# Patient Record
Sex: Female | Born: 1990 | Race: White | Hispanic: No | Marital: Single | Smoking: Never smoker
Health system: Southern US, Community
[De-identification: ages and names within clinical notes are randomized; demographics above are authoritative.]

## PROBLEM LIST (undated history)

## (undated) DIAGNOSIS — I7300 Raynaud's syndrome without gangrene: Secondary | ICD-10-CM

## (undated) DIAGNOSIS — R51 Headache: Secondary | ICD-10-CM

## (undated) DIAGNOSIS — R1900 Intra-abdominal and pelvic swelling, mass and lump, unspecified site: Secondary | ICD-10-CM

## (undated) DIAGNOSIS — F419 Anxiety disorder, unspecified: Secondary | ICD-10-CM

## (undated) HISTORY — PX: OTHER SURGICAL HISTORY: SHX169

## (undated) HISTORY — DX: Anxiety disorder, unspecified: F41.9

## (undated) HISTORY — DX: Intra-abdominal and pelvic swelling, mass and lump, unspecified site: R19.00

## (undated) HISTORY — DX: Raynaud's syndrome without gangrene: I73.00

---

## 2011-11-04 ENCOUNTER — Other Ambulatory Visit: Payer: Self-pay | Admitting: Obstetrics & Gynecology

## 2011-11-04 ENCOUNTER — Ambulatory Visit
Admission: RE | Admit: 2011-11-04 | Discharge: 2011-11-04 | Disposition: A | Discharge status: Home / Self Care | Payer: BC Managed Care – PPO | Source: Ambulatory Visit | Attending: Obstetrics & Gynecology | Admitting: Obstetrics & Gynecology

## 2011-11-04 DIAGNOSIS — R1900 Intra-abdominal and pelvic swelling, mass and lump, unspecified site: Secondary | ICD-10-CM

## 2011-11-04 MED ORDER — IOHEXOL 300 MG/ML  SOLN
100.0000 mL | Freq: Once | INTRAMUSCULAR | Status: AC | PRN
  Administered 2011-11-04: 100 mL via INTRAVENOUS

## 2011-11-15 ENCOUNTER — Encounter: Payer: Self-pay | Admitting: Gynecologic Oncology

## 2011-11-16 ENCOUNTER — Encounter: Payer: Self-pay | Admitting: Gynecology

## 2011-11-16 ENCOUNTER — Ambulatory Visit: Payer: BC Managed Care – PPO | Attending: Gynecology | Admitting: Gynecology

## 2011-11-16 VITALS — BP 122/68 | HR 74 | Temp 97.6°F | Resp 20 | Ht 63.0 in | Wt 149.1 lb

## 2011-11-16 DIAGNOSIS — Z8249 Family history of ischemic heart disease and other diseases of the circulatory system: Secondary | ICD-10-CM | POA: Insufficient documentation

## 2011-11-16 DIAGNOSIS — Z808 Family history of malignant neoplasm of other organs or systems: Secondary | ICD-10-CM | POA: Insufficient documentation

## 2011-11-16 DIAGNOSIS — R1909 Other intra-abdominal and pelvic swelling, mass and lump: Secondary | ICD-10-CM | POA: Insufficient documentation

## 2011-11-16 DIAGNOSIS — R1900 Intra-abdominal and pelvic swelling, mass and lump, unspecified site: Secondary | ICD-10-CM

## 2011-11-16 DIAGNOSIS — Z888 Allergy status to other drugs, medicaments and biological substances status: Secondary | ICD-10-CM | POA: Insufficient documentation

## 2011-11-16 DIAGNOSIS — D279 Benign neoplasm of unspecified ovary: Secondary | ICD-10-CM | POA: Insufficient documentation

## 2011-11-16 DIAGNOSIS — Z833 Family history of diabetes mellitus: Secondary | ICD-10-CM | POA: Insufficient documentation

## 2011-11-16 DIAGNOSIS — D270 Benign neoplasm of right ovary: Secondary | ICD-10-CM

## 2011-11-16 DIAGNOSIS — F411 Generalized anxiety disorder: Secondary | ICD-10-CM | POA: Insufficient documentation

## 2011-11-16 DIAGNOSIS — I7300 Raynaud's syndrome without gangrene: Secondary | ICD-10-CM | POA: Insufficient documentation

## 2011-11-16 NOTE — Progress Notes (Signed)
Consult Note: Gyn-Onc   Emily Conner 21 y.o. female  Chief Complaint  Patient presents with  . Pelvic Mass    New pt       HPI: 21 year old white single female seen in consultation at the request of Dr. Mitzi Hansen regarding management of a newly diagnosed complex pelvic mass.  The patient reports that she has had intermittent lower abdominal pain over the past 4 years. Recently, however, the symptoms have become more frequent and she developed a very severe episode while at work each ago. She was seen at urgent care and subsequently has had an ultrasound and a CT scan of the abdomen and pelvis. The large complex pelvic mass measures 11.7 x 8.7 x 15.1 cm it is predominantly cystic but has multiple thick internal septations and a peripheral rim of enhancement and some peripheral calcifications. In addition there may be a focus of consistent with internal fat. There is no evidence of adenopathy or ascites. She has a normal alpha-fetoprotein, and LDH,. However beta hCG is 63 mIU per mL. The patient reports that she has regular cyclic menses. Is not sexually active, and does not use any oral contraceptives or other medications. Review of Systems:10 point review of systems is negative as noted above.   Vitals: Blood pressure 122/68, pulse 74, temperature 97.6 F (36.4 C), resp. rate 20, height 5\' 3"  (1.6 m), weight 149 lb 1.6 oz (67.631 kg), last menstrual period 11/04/2011.  Physical Exam: General : The patient is a healthy woman in no acute distress.  HEENT: normocephalic, extraoccular movements normal; neck is supple without thyromegally  Lynphnodes: Supraclavicular and inguinal nodes not enlarged  Abdomen: Soft, non-tender, no ascites, no organomegally, , no hernias   There is fullness in the right lower quadrant consistent with the mass that is nontender. There is no rebound. Pelvic:  EGBUS: Normal female  Vagina: Normal, no lesions  Urethra and Bladder: Normal, non-tender  Cervix:  Normal  Uterus: Difficult to outline  Bi-manual examination: Non-tender; a cystic masses palpated predominantly on the right side measuring approximately 10 cm in diameter. Rectal: Deferred Lower extremities: No edema or varicosities. Normal range of motion    Assessment/Plan: Complex pelvic mass which is causing intermittent pain. I believe this is most likely secondary to intermittent torsion. Her recommend that the patient undergo exploratory laparotomy and ovarian cystectomy with intraoperative frozen section. I suspect this is most likely a teratoma although with the elevated hCG a germ cell tumor as a possibility.  The patient and her family are plan a vacation in [place] next week and therefore they would like to defer surgery until June 18. We will plan on performing the surgery through a Pfannenstiel incision.  Allergies  Allergen Reactions  . Minocycline     Headache, dizziness    Past Medical History  Diagnosis Date  . Anxiety   . Raynaud's disease   . Pelvic mass     History reviewed. No pertinent past surgical history.  No current outpatient prescriptions on file.    History   Social History  . Marital Status: Single    Spouse Name: N/A    Number of Children: N/A  . Years of Education: N/A   Occupational History  . Not on file.   Social History Main Topics  . Smoking status: Never Smoker   . Smokeless tobacco: Not on file  . Alcohol Use: No  . Drug Use: Not on file  . Sexually Active: Not on file   Other Topics  Concern  . Not on file   Social History Narrative  . No narrative on file    Family History  Problem Relation Age of Onset  . Hypertension Mother   . Diabetes Maternal Grandmother   . Heart disease Maternal Grandmother   . Heart disease Maternal Grandfather   . Diabetes Paternal Grandfather   . Heart disease Paternal Grandfather   . Skin cancer Paternal Grandfather   . Diabetes Father   . Heart disease Paternal Grandmother        Jeannette Corpus, MD 11/16/2011, 11:38 AM

## 2011-11-29 ENCOUNTER — Encounter (HOSPITAL_COMMUNITY): Payer: Self-pay | Admitting: Pharmacy Technician

## 2011-12-02 ENCOUNTER — Encounter (HOSPITAL_COMMUNITY): Payer: Self-pay

## 2011-12-02 ENCOUNTER — Encounter (HOSPITAL_COMMUNITY)
Admission: RE | Admit: 2011-12-02 | Discharge: 2011-12-02 | Disposition: A | Discharge status: Home / Self Care | Payer: BC Managed Care – PPO | Source: Ambulatory Visit | Attending: Obstetrics & Gynecology | Admitting: Obstetrics & Gynecology

## 2011-12-02 HISTORY — DX: Headache: R51

## 2011-12-02 LAB — COMPREHENSIVE METABOLIC PANEL
AST: 18 U/L (ref 0–37)
Albumin: 4.1 g/dL (ref 3.5–5.2)
Alkaline Phosphatase: 75 U/L (ref 39–117)
CO2: 27 mEq/L (ref 19–32)
Chloride: 103 mEq/L (ref 96–112)
Creatinine, Ser: 0.7 mg/dL (ref 0.50–1.10)
GFR calc non Af Amer: >90 mL/min (ref >90)
Potassium: 3.6 mEq/L (ref 3.5–5.1)
Total Bilirubin: 0.2 mg/dL — ABNORMAL LOW (ref 0.3–1.2)

## 2011-12-02 LAB — CBC
HCT: 41.6 % (ref 36.0–46.0)
MCV: 88.7 fL (ref 78.0–100.0)
Platelets: 239 10*3/uL (ref 150–400)
RBC: 4.69 MIL/uL (ref 3.87–5.11)
WBC: 5.2 10*3/uL (ref 4.0–10.5)

## 2011-12-02 LAB — DIFFERENTIAL
Basophils Absolute: 0 10*3/uL (ref 0.0–0.1)
Basophils Relative: 0 % (ref 0–1)
Eosinophils Absolute: 0.1 10*3/uL (ref 0.0–0.7)
Eosinophils Relative: 1 % (ref 0–5)
Monocytes Absolute: 0.4 10*3/uL (ref 0.1–1.0)
Monocytes Relative: 8 % (ref 3–12)
Neutro Abs: 3.2 10*3/uL (ref 1.7–7.7)

## 2011-12-02 LAB — ABO/RH: ABO/RH(D): A NEG

## 2011-12-02 NOTE — Patient Instructions (Signed)
YOUR SURGERY IS SCHEDULED ON:  Tuesday  6/18  AT 1:25 PM  REPORT TO [facility] AT:  11:00 AM      PHONE # FOR SHORT STAY IS [phone]  FOLLOW YOUR BOWEL PREP INSTRUCTIONS FROM DR. CLARKE-PEARSON'S OFFICE - DAY BEFORE YOUR SURGERY.   DO NOT EAT  ANYTHING AFTER MIDNIGHT THE NIGHT BEFORE YOUR SURGERY.  NO FOOD, NO CHEWING GUM, NO MINTS, NO CANDIES, NO CHEWING TOBACCO.  YOU MAY HAVE CLEAR LIQUIDS TO DRINK FROM MIDNIGHT UNTIL 7:00 AM THE DAY OF YOUR SURGERY--BUT NOTHING TO DRINK AFTER 7:00 AM DAY OF SURGERY.  PLEASE TAKE THE FOLLOWING MEDICATIONS THE AM OF YOUR SURGERY WITH A FEW SIPS OF WATER:  CLONAZEPAM    IF YOU USE INHALERS--USE YOUR INHALERS THE AM OF YOUR SURGERY AND BRING INHALERS TO THE HOSPITAL -TAKE TO SURGERY.    IF YOU ARE DIABETIC:  DO NOT TAKE ANY DIABETIC MEDICATIONS THE AM OF YOUR SURGERY.  IF YOU TAKE INSULIN IN THE EVENINGS--PLEASE ONLY TAKE 1/2 NORMAL EVENING DOSE THE NIGHT BEFORE YOUR SURGERY.  NO INSULIN THE AM OF YOUR SURGERY.  IF YOU HAVE SLEEP APNEA AND USE CPAP OR BIPAP--PLEASE BRING THE MASK --NOT THE MACHINE-NOT THE TUBING   -JUST THE MASK. DO NOT BRING VALUABLES, MONEY, CREDIT CARDS.  CONTACT LENS, DENTURES / PARTIALS, GLASSES SHOULD NOT BE WORN TO SURGERY AND IN MOST CASES-HEARING AIDS WILL NEED TO BE REMOVED.  BRING YOUR GLASSES CASE, ANY EQUIPMENT NEEDED FOR YOUR CONTACT LENS. FOR PATIENTS ADMITTED TO THE HOSPITAL--CHECK OUT TIME THE DAY OF DISCHARGE IS 11:00 AM.  ALL INPATIENT ROOMS ARE PRIVATE - WITH BATHROOM, TELEPHONE, TELEVISION AND WIFI INTERNET. IF YOU ARE BEING DISCHARGED THE SAME DAY OF YOUR SURGERY--YOU CAN NOT DRIVE YOURSELF HOME--AND SHOULD NOT GO HOME ALONE BY TAXI OR BUS.  NO DRIVING OR OPERATING MACHINERY FOR 24 HOURS FOLLOWING ANESTHESIA / PAIN MEDICATIONS.                            SPECIAL INSTRUCTIONS:  CHLORHEXIDINE SOAP SHOWER (other brand names are Betasept and Hibiclens ) PLEASE SHOWER WITH CHLORHEXIDINE THE NIGHT  BEFORE YOUR SURGERY AND THE AM OF YOUR SURGERY. DO NOT USE CHLORHEXIDINE ON YOUR FACE OR PRIVATE AREAS--YOU MAY USE YOUR NORMAL SOAP THOSE AREAS AND YOUR NORMAL SHAMPOO.  WOMEN SHOULD AVOID SHAVING UNDER ARMS AND SHAVING LEGS 48 HOURS BEFORE USING CHLORHEXIDINE TO AVOID SKIN IRRITATION.  DO NOT USE IF ALLERGIC TO CHLORHEXIDINE.  PLEASE READ OVER ANY  FACT SHEETS THAT YOU WERE GIVEN: MRSA INFORMATION, BLOOD TRANSFUSION INFORMATION, INCENTIVE SPIROMETRY TURNING, COUGHING, DEEP BREATHING, LEG EXERCISES ARE IMPORTANT TO DO AFTER YOUR SURGERY.

## 2011-12-02 NOTE — Pre-Procedure Instructions (Signed)
CBC, DIFF, CMET, T/S WERE DONE TODAY PREOP AT [facility]. PREOP INSTRUCTIONS DISCUSSED WITH PT USING TEACH BACK METHOD.

## 2011-12-04 LAB — MRSA CULTURE

## 2011-12-05 NOTE — Progress Notes (Addendum)
Telephone message left with pt and her mother about change in time of surgery for 12/06/11. Message left about NPO after midnight. Telephone number is left on answering machine for pt to call back that she got message. [phone] 12/04/11  Spoke with patient directly that her surgical time has been changed and she is to have nothing to eat or drink after midnight. She is able to verbalize these changes.

## 2011-12-06 ENCOUNTER — Ambulatory Visit (HOSPITAL_COMMUNITY): Payer: BC Managed Care – PPO | Admitting: Anesthesiology

## 2011-12-06 ENCOUNTER — Encounter (HOSPITAL_COMMUNITY): Payer: Self-pay | Admitting: Surgery

## 2011-12-06 ENCOUNTER — Encounter (HOSPITAL_COMMUNITY): Payer: Self-pay | Admitting: Anesthesiology

## 2011-12-06 ENCOUNTER — Encounter (HOSPITAL_COMMUNITY)
Admission: RE | Disposition: A | Discharge status: Home / Self Care | Payer: Self-pay | Source: Ambulatory Visit | Attending: Obstetrics & Gynecology

## 2011-12-06 ENCOUNTER — Inpatient Hospital Stay (HOSPITAL_COMMUNITY)
Admission: RE | Admit: 2011-12-06 | Discharge: 2011-12-08 | DRG: 359 | Disposition: A | Discharge status: Home / Self Care | Payer: BC Managed Care – PPO | Source: Ambulatory Visit | Attending: Obstetrics & Gynecology | Admitting: Obstetrics & Gynecology

## 2011-12-06 ENCOUNTER — Encounter (HOSPITAL_COMMUNITY): Payer: Self-pay | Admitting: *Deleted

## 2011-12-06 DIAGNOSIS — R1900 Intra-abdominal and pelvic swelling, mass and lump, unspecified site: Secondary | ICD-10-CM

## 2011-12-06 DIAGNOSIS — Z01812 Encounter for preprocedural laboratory examination: Secondary | ICD-10-CM

## 2011-12-06 DIAGNOSIS — D279 Benign neoplasm of unspecified ovary: Principal | ICD-10-CM | POA: Diagnosis present

## 2011-12-06 HISTORY — PX: OVARIAN CYST REMOVAL: SHX89

## 2011-12-06 SURGERY — EXCISION, CYST, OVARY
Anesthesia: General | Site: Abdomen | Laterality: Left | Wound class: Clean

## 2011-12-06 MED ORDER — KCL IN DEXTROSE-NACL 20-5-0.45 MEQ/L-%-% IV SOLN
INTRAVENOUS | Status: DC
  Administered 2011-12-06 – 2011-12-07 (×3): via INTRAVENOUS
  Filled 2011-12-06 (×4): qty 1000

## 2011-12-06 MED ORDER — ONDANSETRON HCL 4 MG/2ML IJ SOLN
4.0000 mg | Freq: Four times a day (QID) | INTRAMUSCULAR | Status: DC | PRN
  Administered 2011-12-07: 4 mg via INTRAVENOUS
  Filled 2011-12-06 (×2): qty 2

## 2011-12-06 MED ORDER — PROPOFOL 10 MG/ML IV EMUL
INTRAVENOUS | Status: DC | PRN
  Administered 2011-12-06: 150 mg via INTRAVENOUS

## 2011-12-06 MED ORDER — GLYCOPYRROLATE 0.2 MG/ML IJ SOLN
INTRAMUSCULAR | Status: DC | PRN
  Administered 2011-12-06: .3 mg via INTRAVENOUS

## 2011-12-06 MED ORDER — 0.9 % SODIUM CHLORIDE (POUR BTL) OPTIME
TOPICAL | Status: DC | PRN
  Administered 2011-12-06: 1000 mL
  Administered 2011-12-06: 2000 mL

## 2011-12-06 MED ORDER — SODIUM CHLORIDE 0.9 % IJ SOLN: 9.0000 mL | INTRAMUSCULAR | Status: DC | PRN

## 2011-12-06 MED ORDER — KCL IN DEXTROSE-NACL 20-5-0.45 MEQ/L-%-% IV SOLN
INTRAVENOUS | Status: AC
  Filled 2011-12-06: qty 1000

## 2011-12-06 MED ORDER — ONDANSETRON HCL 4 MG/2ML IJ SOLN: 4.0000 mg | Freq: Four times a day (QID) | INTRAMUSCULAR | Status: DC | PRN

## 2011-12-06 MED ORDER — KETOROLAC TROMETHAMINE 30 MG/ML IJ SOLN
30.0000 mg | Freq: Four times a day (QID) | INTRAMUSCULAR | Status: AC
  Filled 2011-12-06: qty 1

## 2011-12-06 MED ORDER — ZOLPIDEM TARTRATE 5 MG PO TABS: 5.0000 mg | ORAL_TABLET | Freq: Every evening | ORAL | Status: DC | PRN

## 2011-12-06 MED ORDER — KETOROLAC TROMETHAMINE 30 MG/ML IJ SOLN
30.0000 mg | Freq: Four times a day (QID) | INTRAMUSCULAR | Status: AC
  Administered 2011-12-06 (×3): 30 mg via INTRAVENOUS
  Filled 2011-12-06: qty 1

## 2011-12-06 MED ORDER — DEXTROSE 5 % IV SOLN
1.0000 g | INTRAVENOUS | Status: AC
  Administered 2011-12-06: 1 g via INTRAVENOUS
  Filled 2011-12-06: qty 1

## 2011-12-06 MED ORDER — NEOSTIGMINE METHYLSULFATE 1 MG/ML IJ SOLN
INTRAMUSCULAR | Status: DC | PRN
  Administered 2011-12-06: 2.5 mg via INTRAVENOUS

## 2011-12-06 MED ORDER — ONDANSETRON HCL 4 MG PO TABS
4.0000 mg | ORAL_TABLET | Freq: Four times a day (QID) | ORAL | Status: DC | PRN
  Administered 2011-12-07: 4 mg via ORAL
  Filled 2011-12-06: qty 1

## 2011-12-06 MED ORDER — LACTATED RINGERS IV SOLN
INTRAVENOUS | Status: DC
  Administered 2011-12-06 (×2): via INTRAVENOUS

## 2011-12-06 MED ORDER — HYDROMORPHONE 0.3 MG/ML IV SOLN
INTRAVENOUS | Status: AC
  Administered 2011-12-06: 0.3 mg via INTRAVENOUS
  Filled 2011-12-06: qty 25

## 2011-12-06 MED ORDER — BUPIVACAINE LIPOSOME 1.3 % IJ SUSP
20.0000 mL | Freq: Once | INTRAMUSCULAR | Status: AC
  Administered 2011-12-06: 20 mL
  Filled 2011-12-06: qty 20

## 2011-12-06 MED ORDER — ONDANSETRON HCL 4 MG/2ML IJ SOLN
INTRAMUSCULAR | Status: DC | PRN
  Administered 2011-12-06: 4 mg via INTRAVENOUS

## 2011-12-06 MED ORDER — PROMETHAZINE HCL 25 MG/ML IJ SOLN
6.2500 mg | Freq: Four times a day (QID) | INTRAMUSCULAR | Status: DC | PRN
  Administered 2011-12-06: 6.25 mg via INTRAVENOUS

## 2011-12-06 MED ORDER — HEPARIN SODIUM (PORCINE) 1000 UNIT/ML IJ SOLN
INTRAMUSCULAR | Status: AC
  Filled 2011-12-06: qty 1

## 2011-12-06 MED ORDER — OXYCODONE-ACETAMINOPHEN 5-325 MG PO TABS
1.0000 | ORAL_TABLET | ORAL | Status: DC | PRN
  Administered 2011-12-07: 2 via ORAL
  Administered 2011-12-07: 1 via ORAL
  Administered 2011-12-07 (×3): 2 via ORAL
  Administered 2011-12-07: 1 via ORAL
  Administered 2011-12-08 (×2): 2 via ORAL
  Filled 2011-12-06 (×2): qty 2
  Filled 2011-12-06: qty 1
  Filled 2011-12-06 (×3): qty 2
  Filled 2011-12-06: qty 1
  Filled 2011-12-06: qty 2

## 2011-12-06 MED ORDER — MIDAZOLAM HCL 5 MG/5ML IJ SOLN
INTRAMUSCULAR | Status: DC | PRN
  Administered 2011-12-06 (×2): 1 mg via INTRAVENOUS

## 2011-12-06 MED ORDER — CISATRACURIUM BESYLATE (PF) 10 MG/5ML IV SOLN
INTRAVENOUS | Status: DC | PRN
  Administered 2011-12-06: 10 mg via INTRAVENOUS

## 2011-12-06 MED ORDER — HYDROMORPHONE 0.3 MG/ML IV SOLN
INTRAVENOUS | Status: DC
  Administered 2011-12-06 (×3): 0.6 mg via INTRAVENOUS
  Administered 2011-12-06: 0.3 mg via INTRAVENOUS
  Administered 2011-12-07: 0.9 mg via INTRAVENOUS
  Administered 2011-12-07: 2.1 mg via INTRAVENOUS

## 2011-12-06 MED ORDER — LIDOCAINE HCL (CARDIAC) 20 MG/ML IV SOLN
INTRAVENOUS | Status: DC | PRN
  Administered 2011-12-06: 50 mg via INTRAVENOUS

## 2011-12-06 MED ORDER — KETOROLAC TROMETHAMINE 30 MG/ML IJ SOLN
INTRAMUSCULAR | Status: AC
  Administered 2011-12-06: 30 mg via INTRAVENOUS
  Filled 2011-12-06: qty 1

## 2011-12-06 MED ORDER — DIPHENHYDRAMINE HCL 12.5 MG/5ML PO ELIX: 12.5000 mg | ORAL_SOLUTION | Freq: Four times a day (QID) | ORAL | Status: DC | PRN

## 2011-12-06 MED ORDER — FENTANYL CITRATE 0.05 MG/ML IJ SOLN
INTRAMUSCULAR | Status: DC | PRN
  Administered 2011-12-06: 50 ug via INTRAVENOUS
  Administered 2011-12-06: 100 ug via INTRAVENOUS

## 2011-12-06 MED ORDER — NALOXONE HCL 0.4 MG/ML IJ SOLN: 0.4000 mg | INTRAMUSCULAR | Status: DC | PRN

## 2011-12-06 MED ORDER — CLONAZEPAM 0.125 MG PO TBDP
0.2500 mg | ORAL_TABLET | Freq: Two times a day (BID) | ORAL | Status: DC | PRN
  Administered 2011-12-07: 0.25 mg via ORAL

## 2011-12-06 MED ORDER — MAGNESIUM HYDROXIDE 400 MG/5ML PO SUSP
30.0000 mL | Freq: Three times a day (TID) | ORAL | Status: AC
  Administered 2011-12-06 – 2011-12-07 (×3): 30 mL via ORAL
  Filled 2011-12-06 (×3): qty 30

## 2011-12-06 MED ORDER — DIPHENHYDRAMINE HCL 50 MG/ML IJ SOLN: 12.5000 mg | Freq: Four times a day (QID) | INTRAMUSCULAR | Status: DC | PRN

## 2011-12-06 MED ORDER — PROMETHAZINE HCL 25 MG/ML IJ SOLN
INTRAMUSCULAR | Status: AC
  Filled 2011-12-06: qty 1

## 2011-12-06 SURGICAL SUPPLY — 43 items
ATTRACTOMAT 16X20 MAGNETIC DRP (DRAPES) ×2 IMPLANT
BAG URINE DRAINAGE (UROLOGICAL SUPPLIES) IMPLANT
BENZOIN TINCTURE PRP APPL 2/3 (GAUZE/BANDAGES/DRESSINGS) ×2 IMPLANT
CANISTER SUCTION 2500CC (MISCELLANEOUS) ×2 IMPLANT
CLIP TI MEDIUM LARGE 6 (CLIP) IMPLANT
CLOTH BEACON ORANGE TIMEOUT ST (SAFETY) ×2 IMPLANT
COVER SURGICAL LIGHT HANDLE (MISCELLANEOUS) ×2 IMPLANT
DRAPE UTILITY XL STRL (DRAPES) ×2 IMPLANT
DRAPE WARM FLUID 44X44 (DRAPE) ×2 IMPLANT
DRSG TELFA PLUS 4X6 ADH ISLAND (GAUZE/BANDAGES/DRESSINGS) ×2 IMPLANT
ELECT REM PT RETURN 9FT ADLT (ELECTROSURGICAL) ×2
ELECTRODE REM PT RTRN 9FT ADLT (ELECTROSURGICAL) ×1 IMPLANT
GAUZE SPONGE 4X4 16PLY XRAY LF (GAUZE/BANDAGES/DRESSINGS) IMPLANT
GLOVE BIO SURGEON STRL SZ 6.5 (GLOVE) ×4 IMPLANT
GLOVE BIOGEL M STRL SZ7.5 (GLOVE) ×10 IMPLANT
GOWN STRL NON-REIN LRG LVL3 (GOWN DISPOSABLE) ×6 IMPLANT
NS IRRIG 1000ML POUR BTL (IV SOLUTION) ×4 IMPLANT
PACK ABDOMINAL WL (CUSTOM PROCEDURE TRAY) ×2 IMPLANT
SEPRAFILM MEMBRANE 5X6 (MISCELLANEOUS) ×2 IMPLANT
SHEET LAVH (DRAPES) ×2 IMPLANT
SPONGE LAP 18X18 X RAY DECT (DISPOSABLE) ×2 IMPLANT
SUCTION POOLE TIP (SUCTIONS) ×2 IMPLANT
SUT ETHILON 1 LR 30 (SUTURE) IMPLANT
SUT PDS AB 0 CT1 36 (SUTURE) ×4 IMPLANT
SUT PDS AB 1 CTXB1 36 (SUTURE) IMPLANT
SUT PDS AB 3-0 SH 27 (SUTURE) ×2 IMPLANT
SUT SILK 2 0 (SUTURE) ×1
SUT SILK 2 0 30  PSL (SUTURE)
SUT SILK 2 0 30 PSL (SUTURE) IMPLANT
SUT SILK 2-0 18XBRD TIE 12 (SUTURE) ×1 IMPLANT
SUT VIC AB 0 CT1 36 (SUTURE) IMPLANT
SUT VIC AB 2-0 CT2 27 (SUTURE) ×2 IMPLANT
SUT VIC AB 2-0 SH 27 (SUTURE) ×1
SUT VIC AB 2-0 SH 27X BRD (SUTURE) ×1 IMPLANT
SUT VIC AB 3-0 CTX 36 (SUTURE) IMPLANT
SUT VIC AB 3-0 PS2 18 (SUTURE) ×1
SUT VIC AB 3-0 PS2 18XBRD (SUTURE) ×1 IMPLANT
SUT VICRYL 2 0 18  UND BR (SUTURE)
SUT VICRYL 2 0 18 UND BR (SUTURE) IMPLANT
TOWEL NATURAL 10PK STERILE (DISPOSABLE) ×2 IMPLANT
TOWEL OR NON WOVEN STRL DISP B (DISPOSABLE) ×2 IMPLANT
TRAY FOLEY CATH 14FRSI W/METER (CATHETERS) ×2 IMPLANT
WATER STERILE IRR 1500ML POUR (IV SOLUTION) IMPLANT

## 2011-12-06 NOTE — Op Note (Signed)
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 with oral tracheal tube ESTIMATED BLOOD LOSS: Minimal  SURGICAL FINDINGS: The left ovary was replaced by approximately a 13 cm complex cystic mass consistent with a mature teratoma. The right ovary and uterus were normal as were both fallopian tubes. The appendix was visualized appeared normal  PROCEDURE: Patient brought to the operating room and after satisfactory attainment of general anesthesia was placed in a modified lithotomy position in [place] stirrups. The anterior abdominal wall perineum and vagina were prepped, a Foley catheter was inserted, and the patient was draped. The abdomen was entered through a Pfannenstiel incision. Peritoneal washings were obtained from the pelvis although ultimately discarded.. Pelvis was explored with the above-noted findings. In order to deliver the cystic mass through the incision a pursestring suture was placed in the wall of the cyst and the cyst partially drained. We were then able to deliver the cyst above the incision. An incision was made in the cyst wall using a 15 scalpel. Then using sharp and blunt dissection and electrocautery for hemostasis the cyst was dissected from the overlying ovarian capsule. Once the cyst was completely removed, hemostasis in the hilum of the ovary was achieved using cautery. The ovary was then reconstructed with a running layered closure using #3-0 PDS. Care was taken to avoid injury to the fallopian tube.  The pelvis was irrigated copiously with saline. Seprafilm was placed over the reconstructed  ovary.  The anterior abdominal wall was then closed in layers. The first was a running 2-0 vicryl reapproximating the peritoneum and rectus muscle. The rectus was inspected and found to be hemostatic. The fascia was closed in a running suture of 0 pds. Subcutaneous tissue was irrigated and hemostasis achieved with cautery. Skin was closed with a running subcuticular suture of 3-0 Vicryl. Steri-Strips were applied. The patient was awakened from anesthesia and taken to the recovery room in satisfactory condition sponge needle and isthmic counts correct x20 Vicryl suture   De Blanch, M.D

## 2011-12-06 NOTE — Transfer of Care (Signed)
Immediate Anesthesia Transfer of Care Note  Patient: Emily Conner  Procedure(s) Performed: Procedure(s) (LRB): OVARIAN CYSTECTOMY (Left)  Patient Location: PACU  Anesthesia Type: General  Level of Consciousness: awake, alert , oriented and patient cooperative  Airway & Oxygen Therapy: Patient Spontanous Breathing and Patient connected to face mask oxygen  Post-op Assessment: Report given to PACU RN and Post -op Vital signs reviewed and stable  Post vital signs: Reviewed and stable  Complications: No apparent anesthesia complications

## 2011-12-06 NOTE — Progress Notes (Signed)
Pt is alert and oriented, vital signs are stable, bowel sounds are hypoactive, pt tolerating clear liquid diet thus far with no complaints, SCDs on, pt with foley with adequate urine output, drsg to abd with scant amount of drainage area is marked, mother and father at bedside, will continue to monitor Darlin Drop RN 12-06-11 16:52pm

## 2011-12-06 NOTE — H&P (View-Only) (Signed)
Consult Note: Gyn-Onc   Emily Conner 20 y.o. female  Chief Complaint  Patient presents with  . Pelvic Mass    New pt       HPI: 20-year-old white single female seen in consultation at the request of Dr. Moody regarding management of a newly diagnosed complex pelvic mass.  The patient reports that she has had intermittent lower abdominal pain over the past 4 years. Recently, however, the symptoms have become more frequent and she developed a very severe episode while at work each ago. She was seen at urgent care and subsequently has had an ultrasound and a CT scan of the abdomen and pelvis. The large complex pelvic mass measures 11.7 x 8.7 x 15.1 cm it is predominantly cystic but has multiple thick internal septations and a peripheral rim of enhancement and some peripheral calcifications. In addition there may be a focus of consistent with internal fat. There is no evidence of adenopathy or ascites. She has a normal alpha-fetoprotein, and LDH,. However beta hCG is 63 mIU per mL. The patient reports that she has regular cyclic menses. Is not sexually active, and does not use any oral contraceptives or other medications. Review of Systems:10 point review of systems is negative as noted above.   Vitals: Blood pressure 122/68, pulse 74, temperature 97.6 F (36.4 C), resp. rate 20, height 5' 3" (1.6 m), weight 149 lb 1.6 oz (67.631 kg), last menstrual period 11/04/2011.  Physical Exam: General : The patient is a healthy woman in no acute distress.  HEENT: normocephalic, extraoccular movements normal; neck is supple without thyromegally  Lynphnodes: Supraclavicular and inguinal nodes not enlarged  Abdomen: Soft, non-tender, no ascites, no organomegally, , no hernias   There is fullness in the right lower quadrant consistent with the mass that is nontender. There is no rebound. Pelvic:  EGBUS: Normal female  Vagina: Normal, no lesions  Urethra and Bladder: Normal, non-tender  Cervix:  Normal  Uterus: Difficult to outline  Bi-manual examination: Non-tender; a cystic masses palpated predominantly on the right side measuring approximately 10 cm in diameter. Rectal: Deferred Lower extremities: No edema or varicosities. Normal range of motion    Assessment/Plan: Complex pelvic mass which is causing intermittent pain. I believe this is most likely secondary to intermittent torsion. Her recommend that the patient undergo exploratory laparotomy and ovarian cystectomy with intraoperative frozen section. I suspect this is most likely a teratoma although with the elevated hCG a germ cell tumor as a possibility.  The patient and her family are plan a vacation in [place] next week and therefore they would like to defer surgery until June 18. We will plan on performing the surgery through a Pfannenstiel incision.  Allergies  Allergen Reactions  . Minocycline     Headache, dizziness    Past Medical History  Diagnosis Date  . Anxiety   . Raynaud's disease   . Pelvic mass     History reviewed. No pertinent past surgical history.  No current outpatient prescriptions on file.    History   Social History  . Marital Status: Single    Spouse Name: N/A    Number of Children: N/A  . Years of Education: N/A   Occupational History  . Not on file.   Social History Main Topics  . Smoking status: Never Smoker   . Smokeless tobacco: Not on file  . Alcohol Use: No  . Drug Use: Not on file  . Sexually Active: Not on file   Other Topics   Concern  . Not on file   Social History Narrative  . No narrative on file    Family History  Problem Relation Age of Onset  . Hypertension Mother   . Diabetes Maternal Grandmother   . Heart disease Maternal Grandmother   . Heart disease Maternal Grandfather   . Diabetes Paternal Grandfather   . Heart disease Paternal Grandfather   . Skin cancer Paternal Grandfather   . Diabetes Father   . Heart disease Paternal Grandmother        CLARKE-PEARSON,Emily Heumann L, MD 11/16/2011, 11:38 AM         

## 2011-12-06 NOTE — Anesthesia Preprocedure Evaluation (Signed)
Anesthesia Evaluation  Patient identified by MRN, date of birth, ID band Patient awake    Reviewed: Allergy & Precautions, H&P , NPO status , Patient's Chart, lab work & pertinent test results  Airway Mallampati: II TM Distance: >3 FB Neck ROM: Full    Dental No notable dental hx.    Pulmonary neg pulmonary ROS,  breath sounds clear to auscultation  Pulmonary exam normal       Cardiovascular negative cardio ROS  Rhythm:Regular Rate:Normal     Neuro/Psych  Headaches, Anxiety    GI/Hepatic negative GI ROS, Neg liver ROS,   Endo/Other  negative endocrine ROS  Renal/GU negative Renal ROS  negative genitourinary   Musculoskeletal negative musculoskeletal ROS (+)   Abdominal   Peds negative pediatric ROS (+)  Hematology negative hematology ROS (+)   Anesthesia Other Findings   Reproductive/Obstetrics negative OB ROS                           Anesthesia Physical Anesthesia Plan  ASA: II  Anesthesia Plan: General   Post-op Pain Management:    Induction: Intravenous  Airway Management Planned:   Additional Equipment:   Intra-op Plan:   Post-operative Plan: Extubation in OR  Informed Consent: I have reviewed the patients History and Physical, chart, labs and discussed the procedure including the risks, benefits and alternatives for the proposed anesthesia with the patient or authorized representative who has indicated his/her understanding and acceptance.   Dental advisory given  Plan Discussed with: CRNA  Anesthesia Plan Comments:         Anesthesia Quick Evaluation

## 2011-12-06 NOTE — Anesthesia Postprocedure Evaluation (Signed)
  Anesthesia Post-op Note  Patient: Emily Conner  Procedure(s) Performed: Procedure(s) (LRB): OVARIAN CYSTECTOMY (Left)  Patient Location: PACU  Anesthesia Type: General  Level of Consciousness: awake and alert   Airway and Oxygen Therapy: Patient Spontanous Breathing  Post-op Pain: mild  Post-op Assessment: Post-op Vital signs reviewed, Patient's Cardiovascular Status Stable, Respiratory Function Stable, Patent Airway and No signs of Nausea or vomiting  Post-op Vital Signs: stable  Complications: No apparent anesthesia complications

## 2011-12-06 NOTE — Interval H&P Note (Signed)
History and Physical Interval Note:  12/06/2011 7:05 AM  Emily Conner  has presented today for surgery, with the diagnosis of pelvic mass  The various methods of treatment have been discussed with the patient and family. After consideration of risks, benefits and other options for treatment, the patient has consented to  Procedure(s) (LRB): OVARIAN CYSTECTOMY (N/A) as a surgical intervention .  The patient's history has been reviewed, patient examined, no change in status, stable for surgery.  I have reviewed the patients' chart and labs.  Questions were answered to the patient's satisfaction.     CLARKE-PEARSON,Adrie Picking L

## 2011-12-07 LAB — BASIC METABOLIC PANEL
BUN: 4 mg/dL — ABNORMAL LOW (ref 6–23)
CO2: 27 mEq/L (ref 19–32)
Calcium: 8.2 mg/dL — ABNORMAL LOW (ref 8.4–10.5)
Chloride: 105 mEq/L (ref 96–112)
Creatinine, Ser: 0.68 mg/dL (ref 0.50–1.10)
GFR calc Af Amer: >90 mL/min (ref >90)
GFR calc non Af Amer: >90 mL/min (ref >90)
Glucose, Bld: 135 mg/dL — ABNORMAL HIGH (ref 70–99)
Potassium: 4.1 mEq/L (ref 3.5–5.1)
Sodium: 137 mEq/L (ref 135–145)

## 2011-12-07 LAB — CBC
Hemoglobin: 11.7 g/dL — ABNORMAL LOW (ref 12.0–15.0)
MCH: 30.5 pg (ref 26.0–34.0)
RBC: 3.83 MIL/uL — ABNORMAL LOW (ref 3.87–5.11)

## 2011-12-07 MED ORDER — SODIUM CHLORIDE 0.9 % IJ SOLN
3.0000 mL | Freq: Two times a day (BID) | INTRAMUSCULAR | Status: DC
  Administered 2011-12-07: 3 mL via INTRAVENOUS

## 2011-12-07 NOTE — Progress Notes (Signed)
1 Day Post-Op Procedure(s) (LRB): OVARIAN CYSTECTOMY (Left)  Subjective: Patient reports no complaints and tolerating PO.    Objective: Vital signs in last 24 hours: Temp:  [97.1 F (36.2 C)-98.3 F (36.8 C)] 98.2 F (36.8 C) (06/19 0556) Pulse Rate:  [62-99] 98  (06/19 0556) Resp:  [12-19] 12  (06/19 0755) BP: (95-123)/(51-77) 95/64 mmHg (06/19 0556) SpO2:  [90 %-100 %] 100 % (06/19 0755) Weight:  [152 lb 0.8 oz (68.97 kg)] 152 lb 0.8 oz (68.97 kg) (06/18 1343)    Intake/Output from previous day: 06/18 0701 - 06/19 0700 In: 2733.3 [I.V.:2733.3] Out: 3115 [Urine:3015; Blood:100]  Physical Examination: General: alert and cooperative Resp: clear to auscultation bilaterally Cardio: regular rate and rhythm, S1, S2 normal, no murmur, click, rub or gallop GI: soft, non-tender; bowel sounds normal; no masses,  no organomegaly and incision: clean, dry and intact Extremities: extremities normal, atraumatic, no cyanosis or edema  Labs: WBC/Hgb/Hct/Plts:  8.7/11.7/34.3/203 (06/19 0350) BUN/Cr/glu/ALT/AST/amyl/lip:  4/0.68/--/--/--/--/-- (06/19 0350)  Assessment:  21 y.o. s/p Procedure(s): OVARIAN CYSTECTOMY: stable Pain:  Pain is well-controlled on PCA.  GI:  Tolerating po: Yes    Prophylaxis: intermittent pneumatic compression boots.  Plan: Encourage ambulation Advance to PO medication Discontinue IV fluids   LOS: 1 day    Zelig Gacek DEAL 12/07/2011, 8:47 AM

## 2011-12-07 NOTE — Progress Notes (Signed)
UR complete 

## 2011-12-08 ENCOUNTER — Encounter (HOSPITAL_COMMUNITY): Payer: Self-pay | Admitting: Gynecology

## 2011-12-08 MED ORDER — OXYCODONE-ACETAMINOPHEN 5-325 MG PO TABS: 1.0000 | ORAL_TABLET | ORAL | Status: AC | PRN

## 2011-12-08 NOTE — Discharge Summary (Signed)
Physician Discharge Summary  Patient ID: Emily Conner MRN: [id number] DOB/AGE: [birth year] 21 y.o.  Admit date: 12/06/2011 Discharge date: 12/08/2011  Admission Diagnoses: Benign neoplasm of ovary  Discharge Diagnoses:  Principal Problem:  *Benign neoplasm of ovary  Discharged Condition: good  Hospital Course: On 12/06/2011, the patient underwent the following: Procedure(s): OVARIAN CYSTECTOMY.  The postoperative course was uneventful.  She was discharged to home on postoperative day 2 tolerating a regular diet.  Consults: None  Significant Diagnostic Studies: None  Treatments: surgery: See above  Discharge Exam: Blood pressure 107/69, pulse 81, temperature 97.9 F (36.6 C), temperature source Oral, resp. rate 18, height 5\' 3"  (1.6 m), weight 152 lb 0.8 oz (68.97 kg), last menstrual period 11/04/2011, SpO2 98.00%. General appearance: alert and cooperative Resp: clear to auscultation bilaterally Cardio: regular rate and rhythm, S1, S2 normal, no murmur, click, rub or gallop GI: soft, non-tender; bowel sounds normal; no masses,  no organomegaly Extremities: extremities normal, atraumatic, no cyanosis or edema Incision/Wound: Low transverse incision with steris C/D/I  Disposition: Final discharge disposition not confirmed  Discharge Orders    Future Appointments: Provider: Department: Dept Phone: Center:   01/13/2012 9:30 AM Jeannette Corpus, MD [facility] [phone] None     Future Orders Please Complete By Expires   Diet - low sodium heart healthy      Increase activity slowly      Driving Restrictions      Comments:   Do not take narcotics and drive.   Lifting restrictions      Comments:   No lifting greater than 30 lbs.   Sexual Activity Restrictions      Comments:   No sexual activity for 6 weeks.   Call MD for:  temperature >100.4      Call MD for:  persistant nausea and vomiting      Call MD for:  severe uncontrolled pain      Call MD for:   redness, tenderness, or signs of infection (pain, swelling, redness, odor or green/yellow discharge around incision site)      Call MD for:  difficulty breathing, headache or visual disturbances      Call MD for:  hives      Call MD for:  persistant dizziness or light-headedness      Call MD for:  extreme fatigue        Medication List  As of 12/08/2011  9:12 AM   STOP taking these medications         naproxen sodium 220 MG tablet         TAKE these medications         clonazePAM 0.25 MG disintegrating tablet   Commonly known as: KLONOPIN   Take 0.25 mg by mouth 2 (two) times daily as needed. anxiety      ibuprofen 200 MG tablet   Commonly known as: ADVIL,MOTRIN   Take 400 mg by mouth every 6 (six) hours as needed. pain      oxyCODONE-acetaminophen 5-325 MG per tablet   Commonly known as: PERCOCET   Take 1-2 tablets by mouth every 4 (four) hours as needed (moderate to severe pain (when tolerating fluids)).           Follow-up Information    Follow up with Jeannette Corpus, MD on 01/13/2012. (Arrive at 9:15 for a 9:30 appointment.  )    Contact information:   [address] [place] [address] [phone]  Signed: Nasri Boakye DEAL 12/08/2011, 9:12 AM

## 2011-12-08 NOTE — Care Management Note (Signed)
    Page 1 of 1   12/08/2011     12:55:09 PM   CARE MANAGEMENT NOTE 12/08/2011  Patient:  [facility]   Account Number:  [id number]  Date Initiated:  12/07/2011  Documentation initiated by:  Lorenda Ishihara  Subjective/Objective Assessment:   21 yo female admitted s/p ovarian cystectomy. PTA lived at home with parents. PCP is unknown.     Action/Plan:   Anticipated DC Date:  12/10/2011   Anticipated DC Plan:  HOME/SELF CARE      DC Planning Services  CM consult      Choice offered to / List presented to:             Status of service:  Completed, signed off Medicare Important Message given?   (If response is "NO", the following Medicare IM given date fields will be blank) Date Medicare IM given:   Date Additional Medicare IM given:    Discharge Disposition:  HOME/SELF CARE  Per UR Regulation:  Reviewed for med. necessity/level of care/duration of stay  If discussed at Long Length of Stay Meetings, dates discussed:    Comments:

## 2011-12-08 NOTE — Discharge Instructions (Signed)
12/08/2011  Return to work: 4-6 weeks  Activity: 1. Be up and out of the bed during the day.  Take a nap if needed.  You may walk up steps but be careful and use the hand rail.  Stair climbing will tire you more than you think, you may need to stop part way and rest.   2. No lifting or straining for 6 weeks.  3. Do Not drive if you are taking narcotic pain medicine.  4. Shower daily.  Use soap and water on your incision and pat dry; don't rub.   5. No sexual activity and nothing in the vagina for 6 weeks.  Diet: 1. Low sodium Heart Healthy Diet is recommended.  2. It is safe to use a laxative if you have difficulty moving your bowels.   Wound Care: 1. Keep clean and dry.  Shower daily.  Reasons to call the Doctor:   Fever - Oral temperature greater than 100.4 degrees Fahrenheit  Foul-smelling vaginal discharge  Difficulty urinating  Nausea and vomiting  Increased pain at the site of the incision that is unrelieved with pain medicine.  Difficulty breathing with or without chest pain  New calf pain especially if only on one side  Sudden, continuing increased vaginal bleeding with or without clots.   Contacts: For questions or concerns you should contact:  Dr. Antionette Char at [phone]  Dr. Stanford Breed at [facility] [phone]  Ovarian Cystectomy The ovary is a female organ (gonad) that produces the female hormones estrogen and progesterone. The ovary also produces eggs (ovulates) once a month, that become pregnant when united with the female sperm. Ovarian cystectomy is the removal of a sac filled with fluid (cyst) on the ovary. The cyst can be removed separate from the ovary. Sometimes, it must be removed with the ovary. This can be done with a long, lighted tube inserted into 2 or 3 very small incisions, to see the female organs in the pelvis (laparoscope). It can also be done with an incision in the stomach (abdominal).  Most ovarian cysts are not cancer. A  woman in menopause with an ovarian cyst must be checked quickly and have it removed, because of the high incidence of cancer in ovarian cysts in menopausal women. A cancerous ovary should not be removed with a laparoscope. It should only be removed through an abdominal incision. A cancerous cyst should be treated by a gynecology cancer surgeon (oncologist). LET YOUR CAREGIVER KNOW ABOUT:   If you have allergies, especially to medicines.   All the medicines you are taking. This includes over-the-counter medicine, herbal medicines, creams, and eye drops.   Use of steroids, even in a cream form.   Previous problems with anesthetics, novocaine, or previous surgeries.   Possibility of being pregnant.   History of blood clots or other bleeding problems.   Other medical conditions such as diabetes, kidney, heart, or lung problems.  RISKS AND COMPLICATIONS There are some risks involved when you have a general or regional (spinal/epidural) anesthesia.  Excessive bleeding.   Infection.   Injury to other organs.   Blood clots.   You may become infertile.   Death during or after the surgery.   AFTER THE PROCEDURE  If you had laparoscopic surgery, you may go home the same day or stay overnight. If you had abdominal surgery, you may stay in the hospital for a few days. Your intravenous and/or catheter will be removed the first or second day. If you stay in the hospital,  your caregiver will order pain medicine and a sleeping pill, if you need it. You may be given an antibiotic, if your caregiver thinks it is needed. You will be given instructions and necessary medicines before you are discharged from the hospital. HOME CARE INSTRUCTIONS   Only take over-the-counter or prescription medicines for pain, discomfort, or fever as directed by your caregiver. Avoid taking aspirin, as it can cause bleeding.   Your caregiver may give you a prescription for stronger medicine for pain, if you need it.    You may resume your regular diet, exercise, and activities, as directed by your caregiver.   Do not douche or have intercourse until you have permission from your caregiver.   Change your dressings as directed.   Do not drive until your caregiver gives you permission to do so.   Your caregiver may advise you to take showers instead of a bath for a while.   If you become constipated, take a mild laxative (milk of magnesia) with your caregiver's permission. Also, eat more bran foods and increase your fluids.   Take all your medicines as directed.   Take your temperature twice a day and record it.   Do not drink alcohol while taking pain medicine.   Try to have someone home with you for one to two weeks, to help with your household activities.   Keep all your postoperative appointments.  SEEK MEDICAL CARE IF:  You develop a temperature of 102 F (38.9 C) or higher.   You feel sick to your stomach (nausea) and throw up (vomit).   You develop redness, swelling, and pain or fluid coming from the incision.   You have pain when you urinate or blood in your urine.   You develop a rash on your body.   You develop pain or redness where the thin tube (IV) was inserted.   You feel dizzy or lightheaded.   You develop a reaction or side effects from your medicines.   You need stronger pain medicine.  SEEK IMMEDIATE MEDICAL CARE IF:   You have chest pain.   You have shortness of breath.   You pass out.   You develop increasing stomach pain, and your pain medicines do not make it go away.   You develop pain, swelling, or redness in your leg.   You see a yellowish white fluid (pus) coming from the incision.   Your incision is opening.  Document Released: 04/03/2007 Document Revised: 05/26/2011 Document Reviewed: 04/15/2009 [facility] Patient Information 2012 [place], [place].

## 2011-12-08 NOTE — Progress Notes (Signed)
Pt is alert and oriented, vital signs are stable, discharge instruction reviewed with patient and patients mother, incision to abd is within normal limits, pt tolerating regular diet, up ambulating independently, tolerating oral pain medication, pt to follow up with MD, prescription given and questions and concerns answered, will continue to monitor Means, Myrtie Hawk RN 12-08-11

## 2011-12-12 MED FILL — Clonazepam Orally Disintegrating Tab 0.25 MG: ORAL | Qty: 1 | Status: AC

## 2011-12-13 ENCOUNTER — Telehealth: Payer: Self-pay | Admitting: Gynecologic Oncology

## 2011-12-13 NOTE — Telephone Encounter (Signed)
Pt calling about complaints of nausea when eating and waking up with abdominal cramps during the night for the past 3 days.  Denies severe abd pain, vomiting, bleeding from the vagina or rectum.  Reporting having a bowel movement once daily consisting of solid, slightly hardened stool.  Stating that she is only using ibuprofen for pain.  Instructed to begin taking a stool softener daily and to increase fluid intake.  Instructed to avoid spicy foods and to try taking in liquids such as soup to see if the nausea resolves.  Pt wanting to see if she could return to work next Monday.  Surgery was on June 18.  Will check with Dr. Stanford Breed about returning to work and contact the patient.  Reportable signs and symptoms reviewed.

## 2011-12-14 ENCOUNTER — Encounter: Payer: Self-pay | Admitting: Gynecologic Oncology

## 2011-12-14 ENCOUNTER — Telehealth: Payer: Self-pay | Admitting: Gynecologic Oncology

## 2011-12-14 ENCOUNTER — Encounter: Payer: Self-pay | Admitting: *Deleted

## 2011-12-14 NOTE — Telephone Encounter (Signed)
Spoke with the patient to followup on complaints of nausea discussed yesterday. Patient reporting a decrease in nausea since beginning stool softeners and increasing fluid intake.  Discussed the patient's request to return to work next Monday, July 1. Patient reporting that she works in an environment with no heavy lifting requirements. Informed the patient that she would be able to return to work on July 1 only part-time, with no heavy lifting. Recommended that the patient only work part-time the week of July 1 and then to notify the clinic on July 5 or July 8 to evaluate for ability to begin full time work.  Work note faxed to Midwife, Margaretha Seeds. At fax # [phone].  Instructed to call for any further questions or concerns.

## 2011-12-21 ENCOUNTER — Encounter: Payer: Self-pay | Admitting: Gynecologic Oncology

## 2012-01-13 ENCOUNTER — Ambulatory Visit: Payer: BC Managed Care – PPO | Admitting: Gynecology

## 2012-02-03 ENCOUNTER — Ambulatory Visit: Payer: BC Managed Care – PPO | Attending: Gynecology | Admitting: Gynecology

## 2012-02-03 ENCOUNTER — Encounter: Payer: Self-pay | Admitting: Gynecology

## 2012-02-03 VITALS — BP 118/72 | HR 80 | Temp 98.0°F | Resp 18 | Ht 63.0 in | Wt 149.2 lb

## 2012-02-03 DIAGNOSIS — Z09 Encounter for follow-up examination after completed treatment for conditions other than malignant neoplasm: Secondary | ICD-10-CM | POA: Insufficient documentation

## 2012-02-03 DIAGNOSIS — D369 Benign neoplasm, unspecified site: Secondary | ICD-10-CM

## 2012-02-03 NOTE — Patient Instructions (Signed)
You may return to full levels of activity. I recommend he see Dr. Juliene Pina for continuing well woman care. Please consider HPV vaccine.

## 2012-02-03 NOTE — Progress Notes (Signed)
Consult Note: Gyn-Onc   Emily Conner 21 y.o. female  Chief Complaint  Patient presents with  . 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 She had an uncomplicated postoperative course and return to work 2 weeks after surgery. Subsequently she reports she's done well she has no GI or GU symptoms has no pelvic pain pressure or other complications. She's had 1 regular menstrual period since hospital discharge. Her functional status is 100%.  Review of Systems:10 point review of systems is negative as noted above.   Vitals: Blood pressure 118/72, pulse 80, temperature 98 F (36.7 C), resp. rate 18, height 5\' 3"  (1.6 m), weight 149 lb 3.2 oz (67.677 kg), last menstrual period 12/25/2011.  Physical Exam: General : The patient is a healthy woman in no acute distress.  HEENT: normocephalic, extraoccular movements normal; neck is supple without thyromegally  Lynphnodes: Supraclavicular and inguinal nodes not enlarged  Abdomen: Soft, non-tender, no ascites, no organomegally, no masses, no hernias , and the Pfannenstiel incision is healed nicely.   Lower extremities: No edema or varicosities. 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 The patient has had an uncomplicated postoperative course, has healed well, and I am happy to allow her to return to full levels of activity.  She returned to the care of Dr. Mitzi Hansen for routine gynecologic care. I've recommended that she be given the HPV vaccine although she is somewhat hesitant because her mother believes that there could be serious side effects.  Allergies  Allergen Reactions  . Minocycline     Headache, dizziness    Past Medical History  Diagnosis Date  . Anxiety   . Raynaud's disease   . Pelvic mass     SEVERE ABDOMINAL PAIN FOR 4 YEARS--THIS  MIGHT HAPPEN COUPLE OF TIMES A WEEK--BUT RECENTLY THE PAIN WAS SEVERE AND MORE FREQUENT  . Headache     OCCAS MIGRAINES-ALEVE AND DARK ROOM TO TREAT    Past Surgical History  Procedure Date  . No previous surgery   . Ovarian cyst removal 12/06/2011    Procedure: OVARIAN CYSTECTOMY;  Surgeon: Jeannette Corpus, MD;  Location: [facility];  Service: Gynecology;  Laterality: Left;    Current Outpatient Prescriptions  Medication Sig Dispense Refill  . ibuprofen (ADVIL,MOTRIN) 200 MG tablet Take 400 mg by mouth every 6 (six) hours as needed. pain      . clonazePAM (KLONOPIN) 0.25 MG disintegrating tablet Take 0.25 mg by mouth 2 (two) times daily as needed. anxiety        History   Social History  . Marital Status: Single    Spouse Name: N/A    Number of Children: N/A  . Years of Education: N/A   Occupational History  . Not on file.   Social History Main Topics  . Smoking status: Never Smoker   . Smokeless tobacco: Not on file  . Alcohol Use: No  . Drug Use: No  . Sexually Active: Not on file   Other Topics Concern  . Not on file   Social History Narrative  . No narrative on file    Family History  Problem Relation Age of Onset  . Hypertension Mother   . Diabetes Maternal Grandmother   . Heart disease Maternal Grandmother   . Heart disease Maternal Grandfather   .  Diabetes Paternal Grandfather   . Heart disease Paternal Grandfather   . Skin cancer Paternal Grandfather   . Diabetes Father   . Heart disease Paternal Grandmother       Jeannette Corpus, MD 02/03/2012, 8:54 AM

## 2014-01-02 IMAGING — CT CT ABD-PELV W/ CM
2 of 4 series · 10 of 36 positions shown, 17 images · IV contrast (OMNI 300, WATER & [ID] OMNI 300)
Comparison: No priors.

CLINICAL DATA: Right-sided pelvic mass.  Bright red blood in stool.

CT ABDOMEN AND PELVIS WITH CONTRAST
TECHNIQUE: Multidetector CT imaging of the abdomen and pelvis was
performed following the standard protocol during bolus
administration of intravenous contrast.
Contrast: 100mL OMNIPAQUE IOHEXOL 300 MG/ML  SOLN

[Series 3: abd/pelvis with · axial · 0.62mm/px · z∈[-338,-3]mm · 9 of 85 slices shown, 15 images]
[im 9/85  soft-tissue]
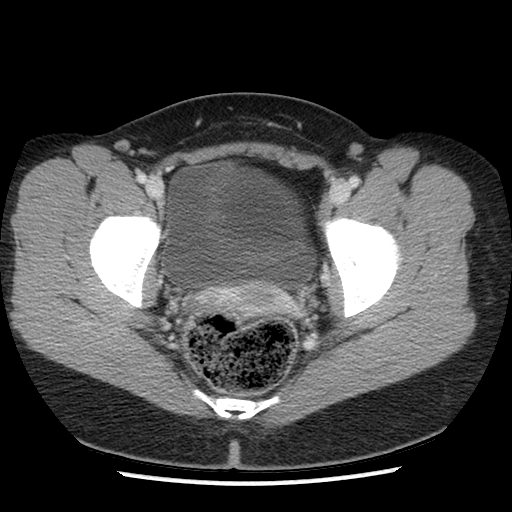
[im 9/85  bone]
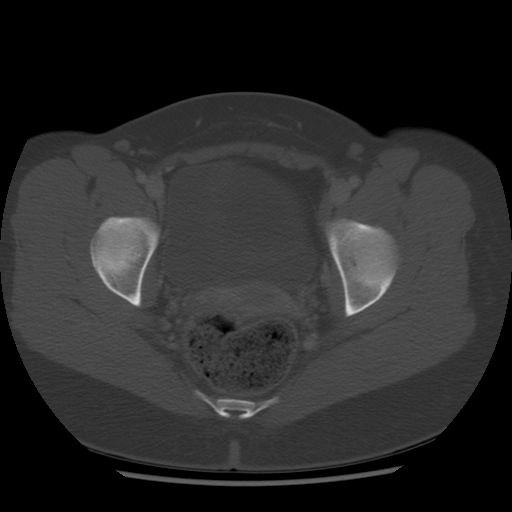
[im 17/85  soft-tissue]
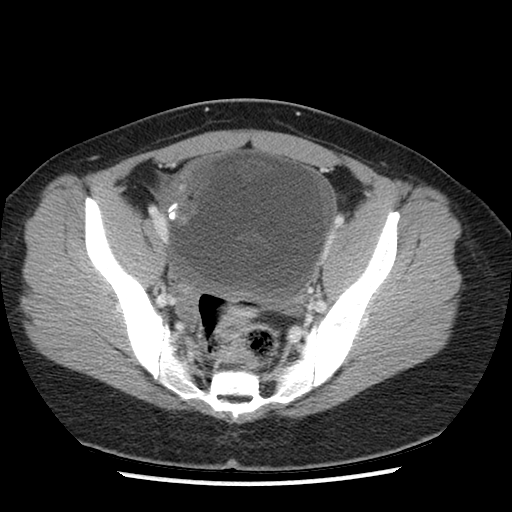
[im 26/85  soft-tissue]
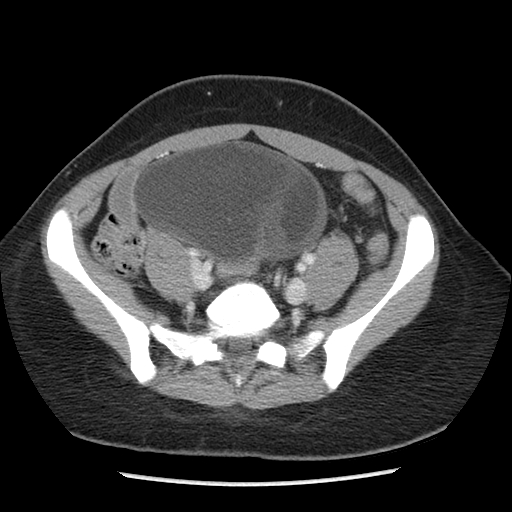
[im 34/85  soft-tissue]
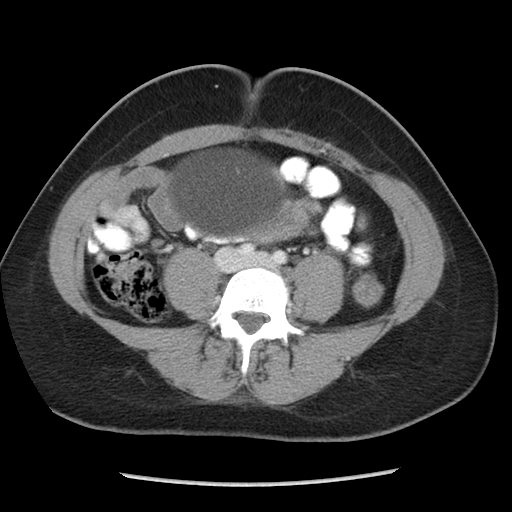
[im 43/85  soft-tissue]
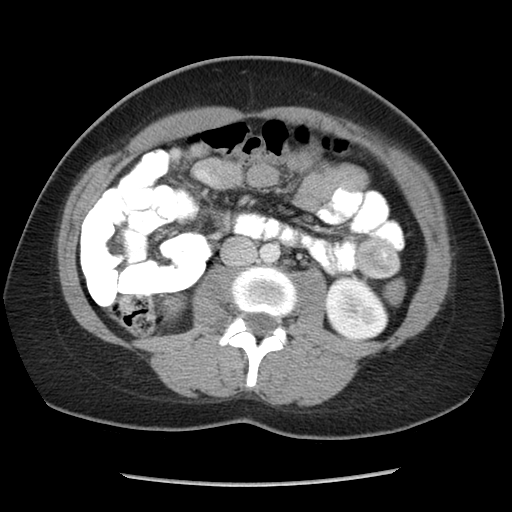
[im 51/85  soft-tissue]
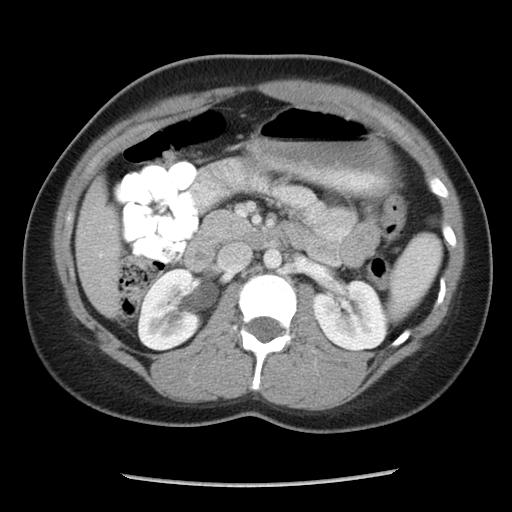
[im 51/85  lung]
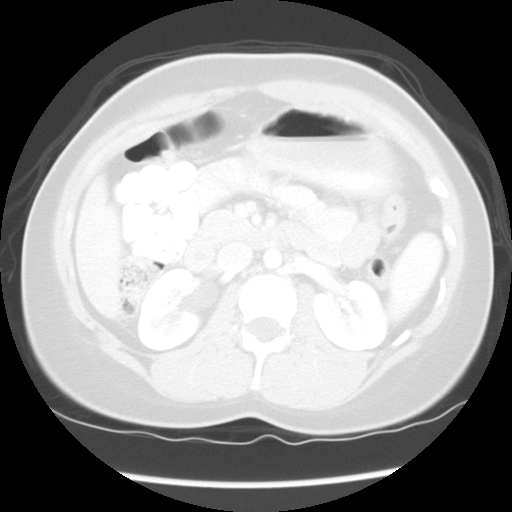
[im 59/85  soft-tissue]
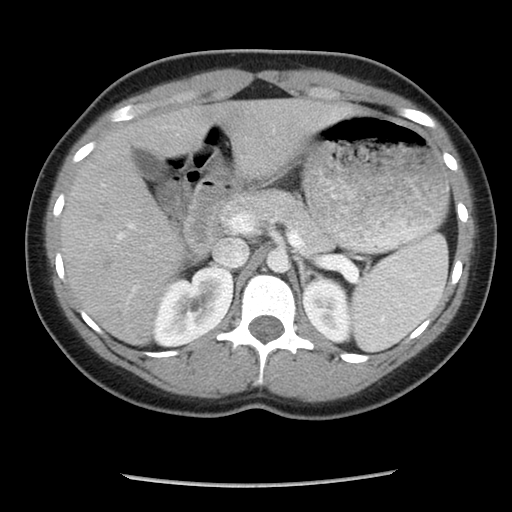
[im 59/85  lung]
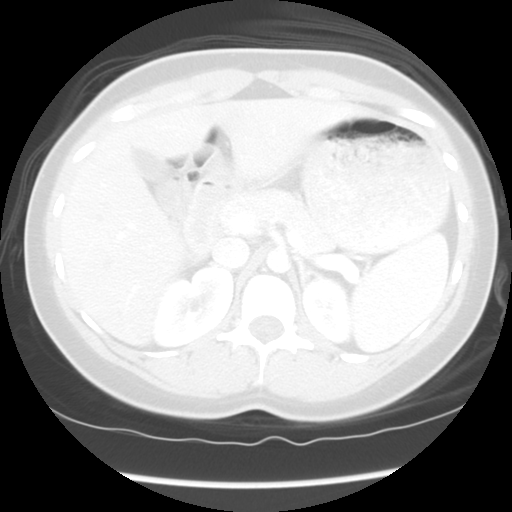
[im 68/85  soft-tissue]
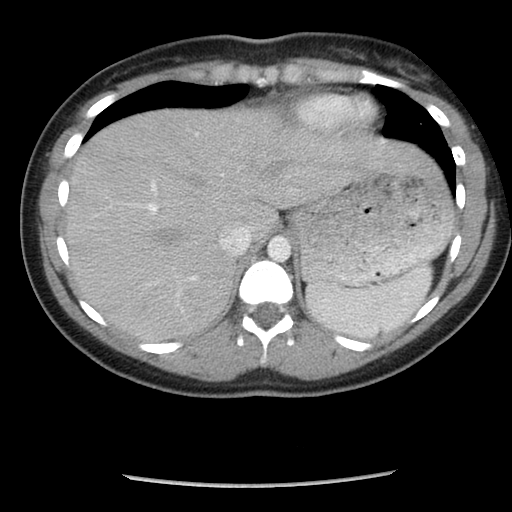
[im 68/85  lung]
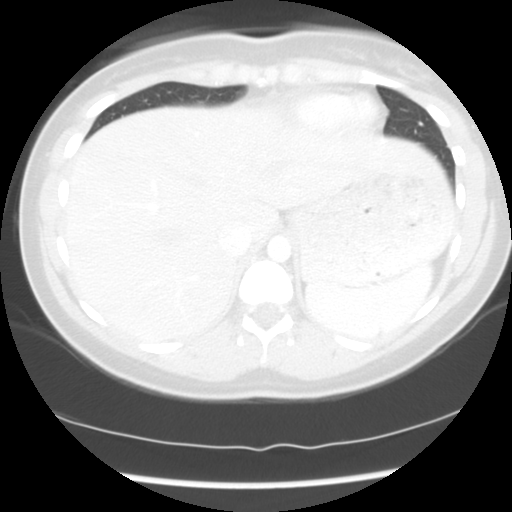
[im 76/85  soft-tissue]
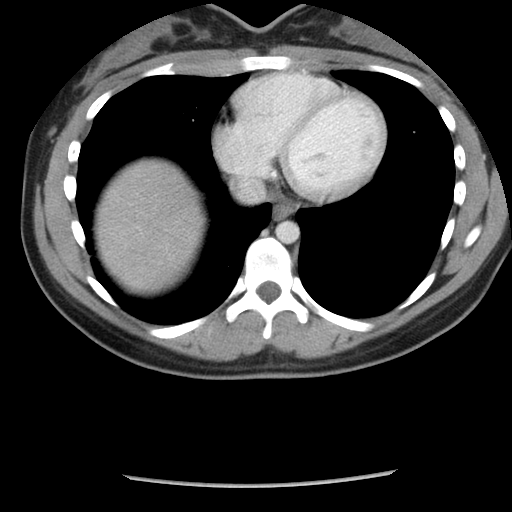
[im 76/85  lung]
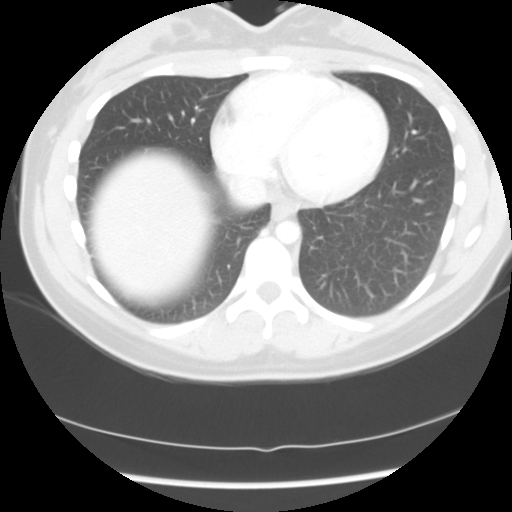
[im 76/85  bone]
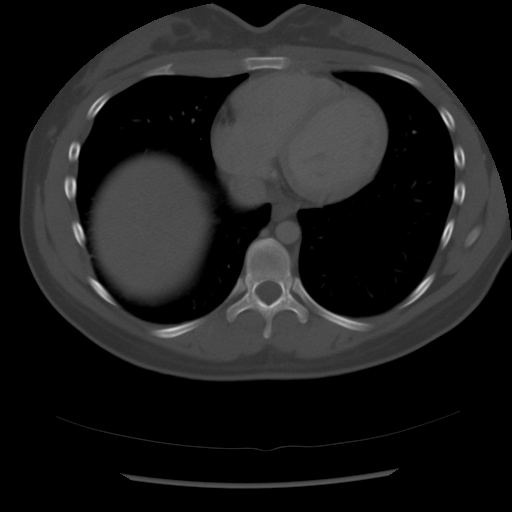

[Series 601: coronal body · coronal · 0.92mm/px · 1 of 119 slices shown, 2 images]
[im 40/119  soft-tissue]
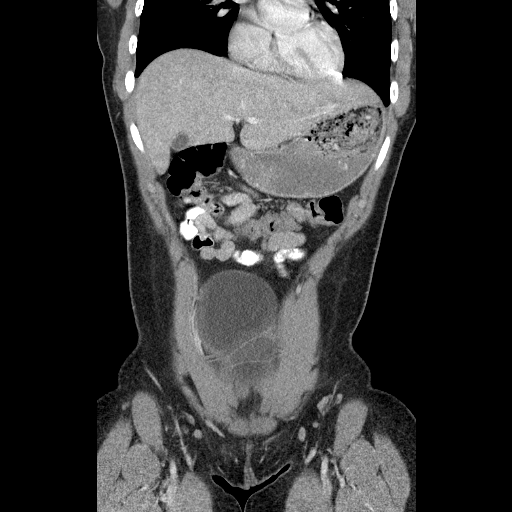
[im 40/119  bone]
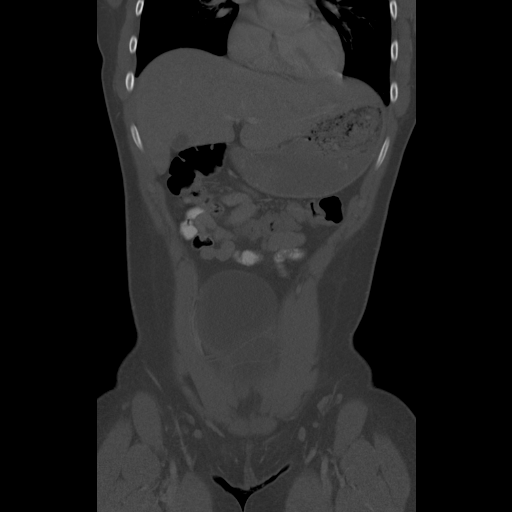

[10 of 36 positions shown; findings below may reference images not displayed]

FINDINGS: Lung Bases: Unremarkable.

Abdomen/Pelvis:  The enhanced appearance of the liver, gallbladder,
pancreas, spleen, bilateral adrenal glands and left kidney is
unremarkable.  There is very mild right-sided
hydroureteronephrosis, presumably secondary to extrinsic
compression of the distal third of the right ureter related to the
large pelvic mass (see below).  The right kidney is otherwise
unremarkable in appearance.

In the anatomic pelvis there is a large complex lesion that
measures at least 11.7 x 8.7 x 15.1 cm.  This lesion appears to be
predominately cystic but has multiple thick internal septations and
peripheral rim of enhancement.  There is some peripheral
calcification within the right side of the mass (images 69 - 71 of
series 3).  Additionally, a image 69 of series 3 demonstrates a
small focus of low attenuation (a negative 40 HU) which could
represent some internal fat.  It is unclear whether not this arises
from the left or the right ovary, but this is strongly favored to
be of ovarian origin.  The uterus is unremarkable in appearance.
There is a trace volume of free fluid in the cul-de-sac.  No larger
volume of ascites is noted.  No other abnormal soft tissue lesions
are noted within the abdomen or pelvis to suggest the presence of
peritoneal implants at this time.  No definite pathologically
enlarged lymph nodes are identified.

Musculoskeletal: There are no aggressive appearing lytic or blastic
lesions noted in the visualized portions of the skeleton.
IMPRESSION: 1.  Large complex 11.7 x 8.7 x 15.1 cm cystic lesion with multiple
internal septations in the anatomic pelvis, presumably of ovarian
origin.  This lesion has some internal calcifications, and may have
some small foci of fat.  Given these imaging findings, this is
favored to represent a large teratoma, however, surgical
consultation is recommended as malignant neoplasm of the ovary is
not entirely excluded. May even if this lesion is benign, this
places the patient at increased risk for ovarian torsion.

These results were called by telephone on 11/04/2011  at  [DATE]
p.m. to  Dr. Boxler (for Dr. Billiot), who verbally acknowledged
these results.

## 2019-10-21 ENCOUNTER — Telehealth (INDEPENDENT_AMBULATORY_CARE_PROVIDER_SITE_OTHER): Payer: Self-pay | Admitting: Internal Medicine

## 2019-10-21 NOTE — Telephone Encounter (Signed)
Please let her know we are sorry we do not have clinic on weekends.  Remind her of the refill medication policy please.  We ask that you give yourself a week to 10 days of medication on hand so these situations do not happen.  Thanks

## 2019-10-21 NOTE — Telephone Encounter (Signed)
Patient left voice mail message stating her pharmacy had sent in a refill request - stated she had to go all weekend without her medication - please advise - [phone]

## 2019-10-21 NOTE — Telephone Encounter (Signed)
Patient is not a patient of ours.
